# Patient Record
Sex: Female | Born: 2005
Health system: Southern US, Community
[De-identification: ages and names within clinical notes are randomized; demographics above are authoritative.]

## PROBLEM LIST (undated history)

## (undated) ENCOUNTER — Emergency Department (HOSPITAL_COMMUNITY): Admission: EM | Payer: Self-pay | Source: Home / Self Care

## (undated) DIAGNOSIS — N390 Urinary tract infection, site not specified: Secondary | ICD-10-CM

## (undated) DIAGNOSIS — J189 Pneumonia, unspecified organism: Secondary | ICD-10-CM

## (undated) DIAGNOSIS — J40 Bronchitis, not specified as acute or chronic: Secondary | ICD-10-CM

---
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
## 2023-06-24 ENCOUNTER — Ambulatory Visit
Admission: RE | Admit: 2023-06-24 | Discharge: 2023-06-24 | Disposition: A | Discharge status: Home / Self Care | Payer: Medicaid Other | Source: Ambulatory Visit | Attending: Family Medicine | Admitting: Family Medicine

## 2023-06-24 ENCOUNTER — Other Ambulatory Visit: Payer: Self-pay

## 2023-06-24 VITALS — BP 130/92 | HR 91 | Temp 98.6°F | Resp 20

## 2023-06-24 DIAGNOSIS — J4521 Mild intermittent asthma with (acute) exacerbation: Secondary | ICD-10-CM | POA: Diagnosis present

## 2023-06-24 DIAGNOSIS — J069 Acute upper respiratory infection, unspecified: Secondary | ICD-10-CM | POA: Diagnosis present

## 2023-06-24 DIAGNOSIS — Z1152 Encounter for screening for COVID-19: Secondary | ICD-10-CM | POA: Diagnosis not present

## 2023-06-24 MED ORDER — PROMETHAZINE-DM 6.25-15 MG/5ML PO SYRP: 5.0000 mL | ORAL_SOLUTION | Freq: Four times a day (QID) | ORAL | 0 refills | Status: DC | PRN

## 2023-06-24 MED ORDER — ALBUTEROL SULFATE HFA 108 (90 BASE) MCG/ACT IN AERS: 2.0000 | INHALATION_SPRAY | RESPIRATORY_TRACT | 0 refills | Status: AC | PRN

## 2023-06-24 NOTE — ED Triage Notes (Addendum)
Pt family reports cough, chills since last night. Hx of bronchitis.

## 2023-06-25 LAB — SARS CORONAVIRUS 2 (TAT 6-24 HRS): SARS Coronavirus 2: NEGATIVE

## 2023-06-28 NOTE — ED Provider Notes (Signed)
[facility]    CSN: [id number] Arrival date & time: 06/24/23  1756      History   Chief Complaint Chief Complaint  Patient presents with   Cough    Entered by patient    HPI Kelsey Good is a 17 y.o. female.   Presenting today with 1 day history of cough, chills, rhinorrhea. Denies wheezing, CP, SOB, fever, abdominal pain, N/V/D. So far not trying anything OTC for sxs. Hx of asthma, bronchitis and pneumonia. Multiple sick contacts at college.     Past Medical History:  Diagnosis Date   Bronchitis    Pneumonia    UTI (lower urinary tract infection)     Patient Active Problem List   Diagnosis Date Noted   Primary atypical pneumonia (PAP) due to Mycoplasma 10/21/2018   Post-tussive emesis 10/21/2018   Refractory nausea and vomiting 10/20/2018   PCOS (polycystic ovarian syndrome) 10/07/2016   Amenorrhea 10/07/2016   Morbid obesity (HCC) 08/09/2013    History reviewed. No pertinent surgical history.  OB History   No obstetric history on file.      Home Medications    Prior to Admission medications   Medication Sig Start Date End Date Taking? Authorizing Provider  amphetamine-dextroamphetamine (ADDERALL XR) 10 MG 24 hr capsule Take 10 mg by mouth daily.   Yes [provider]  promethazine-dextromethorphan (PROMETHAZINE-DM) 6.25-15 MG/5ML syrup Take 5 mLs by mouth 4 (four) times daily as needed. 06/24/23  Yes Particia Nearing, PA-C  albuterol (VENTOLIN HFA) 108 (90 Base) MCG/ACT inhaler Inhale 2 puffs into the lungs every 4 (four) hours as needed for wheezing or shortness of breath. 06/24/23   Particia Nearing, PA-C  chlorhexidine (HIBICLENS) 4 % external liquid Use three times weekly when bathing. 03/28/22   Mardella Layman, MD  desmopressin (DDAVP) 0.2 MG tablet Take 600 mcg by mouth daily. 04/12/21   [provider]  fesoterodine (TOVIAZ) 4 MG TB24 tablet Take by mouth. 05/22/20   [provider]  fluticasone (FLONASE)  50 MCG/ACT nasal spray Place 2 sprays into both nostrils daily. 11/11/21   Domenick Gong, MD  sulfamethoxazole-trimethoprim (BACTRIM DS) 800-160 MG tablet Take 1 tablet by mouth 2 (two) times daily. 03/27/22   Cathlyn Parsons, NP  metFORMIN (GLUCOPHAGE) 500 MG tablet TAKE 1 TABLET DAILY WITH SUPPER. Patient taking differently: Take 500 mg by mouth daily with breakfast.  10/08/17 07/31/19  Campbell Riches, NP  montelukast (SINGULAIR) 10 MG tablet Take 10 mg by mouth at bedtime.  07/31/19  [provider]  Norgestimate-Ethinyl Estradiol Triphasic (TRI-SPRINTEC) 0.18/0.215/0.25 MG-35 MCG tablet Take 1 tablet by mouth daily. 10/08/17 07/31/19  Campbell Riches, NP    Family History Family History  Problem Relation Age of Onset   Diabetes Mother    Obesity Mother    Healthy Father     Social History Social History   Tobacco Use   Smoking status: Never    Passive exposure: Never   Smokeless tobacco: Never  Vaping Use   Vaping status: Never Used  Substance Use Topics   Alcohol use: Never   Drug use: Never     Allergies   Patient has no known allergies.   Review of Systems Review of Systems PER HPI  Physical Exam Triage Vital Signs ED Triage Vitals  Encounter Vitals Group     BP 06/24/23 1837 (!) 130/92     Systolic BP Percentile --      Diastolic BP Percentile --  Pulse Rate 06/24/23 1837 91     Resp 06/24/23 1837 20     Temp 06/24/23 1837 98.6 F (37 C)     Temp Source 06/24/23 1837 Oral     SpO2 06/24/23 1837 96 %     Weight --      Height --      Head Circumference --      Peak Flow --      Pain Score 06/24/23 1838 0     Pain Loc --      Pain Education --      Exclude from Growth Chart --    No data found.  Updated Vital Signs BP (!) 130/92 (BP Location: Right Arm)   Pulse 91   Temp 98.6 F (37 C) (Oral)   Resp 20   LMP 05/30/2023 (Approximate)   SpO2 96%   Visual Acuity Right Eye Distance:   Left Eye Distance:   Bilateral  Distance:    Right Eye Near:   Left Eye Near:    Bilateral Near:     Physical Exam Vitals and nursing note reviewed.  Constitutional:      Appearance: Normal appearance.  HENT:     Head: Atraumatic.     Right Ear: Tympanic membrane and external ear normal.     Left Ear: Tympanic membrane and external ear normal.     Nose: Rhinorrhea present.     Mouth/Throat:     Mouth: Mucous membranes are moist.     Pharynx: Posterior oropharyngeal erythema present.  Eyes:     Extraocular Movements: Extraocular movements intact.     Conjunctiva/sclera: Conjunctivae normal.  Cardiovascular:     Rate and Rhythm: Normal rate and regular rhythm.     Heart sounds: Normal heart sounds.  Pulmonary:     Effort: Pulmonary effort is normal.     Breath sounds: Normal breath sounds. No wheezing.  Musculoskeletal:        General: Normal range of motion.     Cervical back: Normal range of motion and neck supple.  Skin:    General: Skin is warm and dry.  Neurological:     Mental Status: She is alert and oriented to person, place, and time.  Psychiatric:        Mood and Affect: Mood normal.        Thought Content: Thought content normal.      UC Treatments / Results  Labs (all labs ordered are listed, but only abnormal results are displayed) Labs Reviewed  SARS CORONAVIRUS 2 (TAT 6-24 HRS)    EKG   Radiology No results found.  Procedures Procedures (including critical care time)  Medications Ordered in UC Medications - No data to display  Initial Impression / Assessment and Plan / UC Course  I have reviewed the triage vital signs and the nursing notes.  Pertinent labs & imaging results that were available during my care of the patient were reviewed by me and considered in my medical decision making (see chart for details).     Vitals and exam reassuring and suspicious for viral URI leading to asthma flare. COVID testing pending, treat with albuterol, phenergan DM and supportive  OTC medications and home care. School note given, return for worsening sxs.  Final Clinical Impressions(s) / UC Diagnoses   Final diagnoses:  Viral URI with cough  Mild intermittent asthma with acute exacerbation   Discharge Instructions   None    ED Prescriptions     Medication  Sig Dispense Auth. Provider   albuterol (VENTOLIN HFA) 108 (90 Base) MCG/ACT inhaler Inhale 2 puffs into the lungs every 4 (four) hours as needed for wheezing or shortness of breath. 18 g Roosvelt Maser [place], [place]   promethazine-dextromethorphan (PROMETHAZINE-DM) 6.25-15 MG/5ML syrup Take 5 mLs by mouth 4 (four) times daily as needed. 100 mL Particia Nearing, [place]      PDMP not reviewed this encounter.   Particia Nearing, [place] 06/28/23 2247

## 2023-09-21 ENCOUNTER — Other Ambulatory Visit (HOSPITAL_BASED_OUTPATIENT_CLINIC_OR_DEPARTMENT_OTHER): Payer: Self-pay

## 2023-09-21 DIAGNOSIS — R0683 Snoring: Secondary | ICD-10-CM

## 2023-10-13 ENCOUNTER — Ambulatory Visit (HOSPITAL_COMMUNITY): Payer: Self-pay

## 2023-12-21 ENCOUNTER — Ambulatory Visit: Payer: Self-pay

## 2024-05-04 ENCOUNTER — Telehealth: Admitting: Physician Assistant

## 2024-05-04 DIAGNOSIS — J069 Acute upper respiratory infection, unspecified: Secondary | ICD-10-CM | POA: Diagnosis not present

## 2024-05-04 MED ORDER — BENZONATATE 100 MG PO CAPS: 100.0000 mg | ORAL_CAPSULE | Freq: Three times a day (TID) | ORAL | 0 refills | Status: DC | PRN

## 2024-05-04 NOTE — Progress Notes (Signed)

## 2024-05-04 NOTE — Progress Notes (Signed)
 I have spent 5 minutes in review of e-visit questionnaire, review and updating patient chart, medical decision making and response to patient.   Piedad Climes, PA-C

## 2024-05-31 ENCOUNTER — Telehealth: Admitting: Physician Assistant

## 2024-05-31 DIAGNOSIS — J029 Acute pharyngitis, unspecified: Secondary | ICD-10-CM | POA: Diagnosis not present

## 2024-05-31 MED ORDER — LIDOCAINE VISCOUS HCL 2 % MT SOLN: 5.0000 mL | Freq: Four times a day (QID) | OROMUCOSAL | 0 refills | Status: AC | PRN

## 2024-05-31 NOTE — Progress Notes (Signed)
 E-Visit for Sore Throat  We are sorry that you are not feeling well.  Here is how we plan to help!  Your symptoms indicate a likely viral infection (Pharyngitis).   Pharyngitis is inflammation in the back of the throat which can cause a sore throat, scratchiness and sometimes difficulty swallowing.   Pharyngitis is typically caused by a respiratory virus and will just run its course.  Please keep in mind that your symptoms could last up to 10 days.  For throat pain, we recommend over the counter oral pain relief medications such as acetaminophen  or aspirin, or anti-inflammatory medications such as ibuprofen  or naproxen sodium.  Topical treatments such as oral throat lozenges or sprays may be used as needed.  I have prescribed Viscous Lidocaine  2% Swallow 5mL every 6 hours as needed for sore throat. Do not eat or drink anything for 15-20 minutes after taking to allow medication to coat the throat and not be washed down into the stomach. Avoid close contact with loved ones, especially the very young and elderly.  Remember to wash your hands thoroughly throughout the day as this is the number one way to prevent the spread of infection and wipe down door knobs and counters with disinfectant.  After careful review of your answers, I would not recommend an antibiotic for your condition.  Antibiotics should not be used to treat conditions that we suspect are caused by viruses like the virus that causes the common cold or flu. However, some people can have Strep with atypical symptoms. You may need formal testing in clinic or office to confirm if your symptoms continue or worsen.  Providers prescribe antibiotics to treat infections caused by bacteria. Antibiotics are very powerful in treating bacterial infections when they are used properly.  To maintain their effectiveness, they should be used only when necessary.  Overuse of antibiotics has resulted in the development of super bugs that are resistant to  treatment!    Home Care: Only take medications as instructed by your medical team. Do not drink alcohol while taking these medications. A steam or ultrasonic humidifier can help congestion.  You can place a towel over your head and breathe in the steam from hot water coming from a faucet. Avoid close contacts especially the very young and the elderly. Cover your mouth when you cough or sneeze. Always remember to wash your hands.  Get Help Right Away If: You develop worsening fever or throat pain. You develop a severe head ache or visual changes. Your symptoms persist after you have completed your treatment plan.  Make sure you Understand these instructions. Will watch your condition. Will get help right away if you are not doing well or get worse.   Thank you for choosing an e-visit.  Your e-visit answers were reviewed by a board certified advanced clinical practitioner to complete your personal care plan. Depending upon the condition, your plan could have included both over the counter or prescription medications.  Please review your pharmacy choice. Make sure the pharmacy is open so you can pick up prescription now. If there is a problem, you may contact your provider through [employer] and have the prescription routed to another pharmacy.  Your safety is important to us . If you have drug allergies check your prescription carefully.   For the next 24 hours you can use MyChart to ask questions about today's visit, request a non-urgent call back, or ask for a work or school excuse. You will get an email in  the next two days asking about your experience. I hope that your e-visit has been valuable and will speed your recovery.    I have spent 5 minutes in review of e-visit questionnaire, review and updating patient chart, medical decision making and response to patient.   Delon CHRISTELLA Dickinson, PA-C
